# Patient Record
Sex: Female | Born: 1975 | Race: Black or African American | Hispanic: No | Marital: Single | Smoking: Current every day smoker
Health system: Southern US, Community
[De-identification: ages and names within clinical notes are randomized; demographics above are authoritative.]

---

## 2006-11-24 ENCOUNTER — Emergency Department: Payer: Self-pay | Admitting: Emergency Medicine

## 2007-07-28 ENCOUNTER — Emergency Department: Payer: Self-pay | Admitting: Unknown Physician Specialty

## 2008-11-23 ENCOUNTER — Emergency Department: Payer: Self-pay | Admitting: Internal Medicine

## 2010-05-29 ENCOUNTER — Emergency Department: Payer: Self-pay | Admitting: Emergency Medicine

## 2013-03-26 ENCOUNTER — Emergency Department: Payer: Self-pay | Admitting: Emergency Medicine

## 2013-10-01 ENCOUNTER — Emergency Department: Payer: Self-pay | Admitting: Internal Medicine
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
## 2017-12-24 ENCOUNTER — Other Ambulatory Visit: Payer: Self-pay

## 2017-12-24 ENCOUNTER — Emergency Department: Payer: Self-pay

## 2017-12-24 ENCOUNTER — Emergency Department
Admission: EM | Admit: 2017-12-24 | Discharge: 2017-12-24 | Disposition: A | Discharge status: Home / Self Care | Payer: Self-pay | Attending: Emergency Medicine | Admitting: Emergency Medicine

## 2017-12-24 DIAGNOSIS — R600 Localized edema: Secondary | ICD-10-CM | POA: Insufficient documentation

## 2017-12-24 DIAGNOSIS — M79662 Pain in left lower leg: Secondary | ICD-10-CM | POA: Insufficient documentation

## 2017-12-24 MED ORDER — MELOXICAM 7.5 MG PO TABS: 7.5000 mg | ORAL_TABLET | Freq: Every day | ORAL | 1 refills | Status: AC

## 2017-12-24 NOTE — ED Provider Notes (Signed)
[facility] Emergency Department Provider Note  ____________________________________________  Time seen: Approximately 8:23 PM  I have reviewed the triage vital signs and the nursing notes.   HISTORY  Chief Complaint Knee Pain    HPI Sandra Goodwin is a 42 y.o. female presents to the emergency department with 5 out of 10 left calf pain for the past 3 weeks.  Patient reports that pain radiates from left ankle into left calf.  She denies falls or mechanisms of trauma.  Patient reports noticing left ankle edema.  She denies a history of cough or increased pillow usage.  No history of CHF.  Patient denies a history of DVTs, recent travel, recent surgery or prolonged immobilization.  Patient is a daily smoker.  She denies chest pain, chest tightness, shortness of breath, nausea, vomiting abdominal pain.  No history of increased physical activity.   No past medical history on file.  There are no active problems to display for this patient.    Prior to Admission medications   Medication Sig Start Date End Date Taking? Authorizing Provider  meloxicam (MOBIC) 7.5 MG tablet Take 1 tablet (7.5 mg total) by mouth daily for 7 days. 12/24/17 12/31/17  Orvil FeilWoods, Johnpaul Gillentine M, PA-C    Allergies Patient has no known allergies.  No family history on file.  Social History Social History   Tobacco Use  . Smoking status: Not on file  Substance Use Topics  . Alcohol use: Not on file  . Drug use: Not on file     Review of Systems  Constitutional: No fever/chills Eyes: No visual changes. No discharge ENT: No upper respiratory complaints. Cardiovascular: no chest pain. Respiratory: no cough. No SOB. Gastrointestinal: No abdominal pain.  No nausea, no vomiting.  No diarrhea.  No constipation. Musculoskeletal: Patient has left calf pain.  Skin: Negative for rash, abrasions, lacerations, ecchymosis. Neurological: Negative for headaches, focal weakness or  numbness.  ____________________________________________   PHYSICAL EXAM:  VITAL SIGNS: ED Triage Vitals  Enc Vitals Group     BP 12/24/17 1917 (!) 130/103     Pulse Rate 12/24/17 1917 75     Resp 12/24/17 1917 20     Temp 12/24/17 1917 98.5 F (36.9 C)     Temp Source 12/24/17 1917 Oral     SpO2 12/24/17 1917 100 %     Weight 12/24/17 1918 195 lb (88.5 kg)     Height 12/24/17 1918 5' (1.524 m)     Head Circumference --      Peak Flow --      Pain Score 12/24/17 1917 8     Pain Loc --      Pain Edu? --      Excl. in GC? --      Constitutional: Alert and oriented. Well appearing and in no acute distress. Eyes: Conjunctivae are normal. PERRL. EOMI. Head: Atraumatic. Cardiovascular: Normal rate, regular rhythm. Normal S1 and S2.  Good peripheral circulation. Respiratory: Normal respiratory effort without tachypnea or retractions. Lungs CTAB. Good air entry to the bases with no decreased or absent breath sounds. Musculoskeletal: Patient is able to perform full dorsiflexion and plantar flexion at the left ankle.  She is able to perform inversion and eversion.  She is able to move all 5 left toes.  Palpable dorsalis pedis pulse, left.  No pain is elicited with palpation of the left calf. Neurologic:  Normal speech and language. No gross focal neurologic deficits are appreciated.  Skin: No erythema or  edema overlying the left calf.  ___________________________________  LABS (all labs ordered are listed, but only abnormal results are displayed)  Labs Reviewed - No data to display ____________________________________________  EKG   ____________________________________________  RADIOLOGY Geraldo Pitter, personally viewed and evaluated these images as part of my medical decision making, as well as reviewing the written report by the radiologist.    Dg Ankle Complete Left  Result Date: 12/24/2017 CLINICAL DATA:  42 year old female with left foot cramping and pain while  walking. No known injury. EXAM: LEFT ANKLE COMPLETE - 3+ VIEW COMPARISON:  None. FINDINGS: There is no evidence of fracture, dislocation, or joint effusion. There is no evidence of arthropathy or other focal bone abnormality. Soft tissues are unremarkable. Remote healed fracture of the fifth metatarsal. IMPRESSION: 1. No acute abnormality. 2. Remote healed fracture of the fifth metatarsal. Electronically Signed   By: Malachy Moan M.D.   On: 12/24/2017 20:30   US Venous Img Lower Unilateral Left  Result Date: 12/24/2017 CLINICAL DATA:  Left lower extremity pain for 3 weeks. EXAM: LEFT LOWER EXTREMITY VENOUS DOPPLER ULTRASOUND TECHNIQUE: Gray-scale sonography with graded compression, as well as color Doppler and duplex ultrasound were performed to evaluate the lower extremity deep venous systems from the level of the common femoral vein and including the common femoral, femoral, profunda femoral, popliteal and calf veins including the posterior tibial, peroneal and gastrocnemius veins when visible. The superficial great saphenous vein was also interrogated. Spectral Doppler was utilized to evaluate flow at rest and with distal augmentation maneuvers in the common femoral, femoral and popliteal veins. COMPARISON:  None. FINDINGS: Contralateral Common Femoral Vein: Respiratory phasicity is normal and symmetric with the symptomatic side. No evidence of thrombus. Normal compressibility. Common Femoral Vein: No evidence of thrombus. Normal compressibility, respiratory phasicity and response to augmentation. Saphenofemoral Junction: No evidence of thrombus. Normal compressibility and flow on color Doppler imaging. Profunda Femoral Vein: No evidence of thrombus. Normal compressibility and flow on color Doppler imaging. Femoral Vein: No evidence of thrombus. Normal compressibility, respiratory phasicity and response to augmentation. Popliteal Vein: No evidence of thrombus. Normal compressibility, respiratory  phasicity and response to augmentation. Calf Veins: No evidence of thrombus. Normal compressibility and flow on color Doppler imaging. Superficial Great Saphenous Vein: No evidence of thrombus. Normal compressibility. Venous Reflux:  None. Other Findings: Simple 2.9 x 1.1 x 3.1 cm Baker's cyst in the medial left popliteal fossa. IMPRESSION: No evidence of deep venous thrombosis in the left lower extremity. Left-sided Baker's cyst as detailed. Electronically Signed   By: Delbert Phenix M.D.   On: 12/24/2017 21:46    ____________________________________________    PROCEDURES  Procedure(s) performed:    Procedures    Medications - No data to display   ____________________________________________   INITIAL IMPRESSION / ASSESSMENT AND PLAN / ED COURSE  Pertinent labs & imaging results that were available during my care of the patient were reviewed by me and considered in my medical decision making (see chart for details).  Review of the [place] CSRS was performed in accordance of the [facility] prior to dispensing any controlled drugs.     Assessment and Plan:  Left calf pain Patient presents to the emergency department with left calf pain that has occurred intermittently for the past 3 weeks in the absence of trauma.  Differential diagnosis originally included DVT versus musculoskeletal strain versus arthritis.  Workup conducted in the emergency department was noncontributory for thromboembolism.  X-ray examination revealed no acute fractures or bony  abnormalities.  Patient was discharged with meloxicam and advised to seek care with podiatry if symptoms persist.  Strict return precautions were given to return to the emergency department for new or worsening symptoms.  All patient questions were answered.    ____________________________________________  FINAL CLINICAL IMPRESSION(S) / ED DIAGNOSES  Final diagnoses:  Pain of left calf      NEW MEDICATIONS STARTED DURING THIS VISIT:  ED  Discharge Orders        Ordered    meloxicam (MOBIC) 7.5 MG tablet  Daily     12/24/17 2231          This chart was dictated using voice recognition software/Dragon. Despite best efforts to proofread, errors can occur which can change the meaning. Any change was purely unintentional.    Orvil Feil, PA-C 12/25/17 Donnamarie Rossetti, MD 12/25/17 [phone]

## 2017-12-24 NOTE — ED Triage Notes (Signed)
Pt in with co cramping to left foot radiating up to left knee. States it "locks up" at times making it painful to walk. Denies any injury, states has had pain for about a month.

## 2020-04-26 ENCOUNTER — Other Ambulatory Visit: Payer: Self-pay | Admitting: Physician Assistant

## 2020-04-26 DIAGNOSIS — N611 Abscess of the breast and nipple: Secondary | ICD-10-CM

## 2020-05-04 ENCOUNTER — Ambulatory Visit
Admission: RE | Admit: 2020-05-04 | Discharge: 2020-05-04 | Disposition: A | Discharge status: Home / Self Care | Payer: BC Managed Care – PPO | Source: Ambulatory Visit | Attending: Physician Assistant | Admitting: Physician Assistant

## 2020-05-04 DIAGNOSIS — N611 Abscess of the breast and nipple: Secondary | ICD-10-CM | POA: Diagnosis not present

## 2020-05-05 ENCOUNTER — Other Ambulatory Visit: Payer: Self-pay

## 2020-05-05 ENCOUNTER — Other Ambulatory Visit: Payer: Self-pay | Admitting: Physician Assistant

## 2020-05-05 DIAGNOSIS — R922 Inconclusive mammogram: Secondary | ICD-10-CM

## 2020-10-10 ENCOUNTER — Encounter: Payer: Self-pay | Admitting: Podiatry

## 2020-10-10 ENCOUNTER — Other Ambulatory Visit: Payer: Self-pay

## 2020-10-10 ENCOUNTER — Ambulatory Visit: Payer: BC Managed Care – PPO | Admitting: Podiatry

## 2020-10-10 DIAGNOSIS — B070 Plantar wart: Secondary | ICD-10-CM

## 2020-10-10 NOTE — Progress Notes (Signed)
  Subjective:  Patient ID: Sandra Goodwin, female    DOB: Nov 14, 1976,  MRN: [id number]  Chief Complaint  Patient presents with  . Callouses    Patient presents today for painful callous lesion bottom of right forefoot x 1 year.  She says "feels like Im walking on a rock"     44 y.o. female presents with the above complaint. History confirmed with patient. Has tried getting removed with pedicures w/o success  Objective:  Physical Exam: warm, good capillary refill, no trophic changes or ulcerative lesions, normal DP and PT pulses and normal sensory exam.   Right Foot: central submet 3-4 lesion with disrupted skin lines and hyperkeratosis, tender with lateral compression    Assessment:   1. Verruca plantaris      Plan:  Patient was evaluated and treated and all questions answered.   -Educated on etiology -Lesion debrided and destroyed with cantharone and salinocaine -Recommend daily Sal Acid use OTC -Offloading pad applied  Return in about 3 weeks (around 10/31/2020).

## 2020-10-10 NOTE — Patient Instructions (Signed)
Take dressing off in 8 hours and wash the foot with soap and water. If it is hurting or becomes uncomfortable before the 8 hours, go ahead and remove the bandage and wash the area.  If it blisters, apply antibiotic ointment and a band-aid.  Monitor for any signs/symptoms of infection. Call the office immediately if any occur or go directly to the emergency room. Call with any questions/concerns.   

## 2020-10-19 ENCOUNTER — Encounter: Payer: Self-pay | Admitting: Podiatry

## 2020-11-02 ENCOUNTER — Ambulatory Visit: Payer: BC Managed Care – PPO | Admitting: Podiatry

## 2021-10-30 IMAGING — MG DIGITAL DIAGNOSTIC BILAT W/ TOMO W/ CAD
8 series · 8 of 24 positions shown · non-contrast
Comparison: None.

CLINICAL DATA: Recent abscess to the right breast. The patient is a
smoker. The patient is finishing her second course of antibiotics
with significant clinical improvement.

EXAM:
DIGITAL DIAGNOSTIC BILATERAL MAMMOGRAM WITH CAD AND TOMO
ULTRASOUND RIGHT BREAST

[L MLO synth-2D]
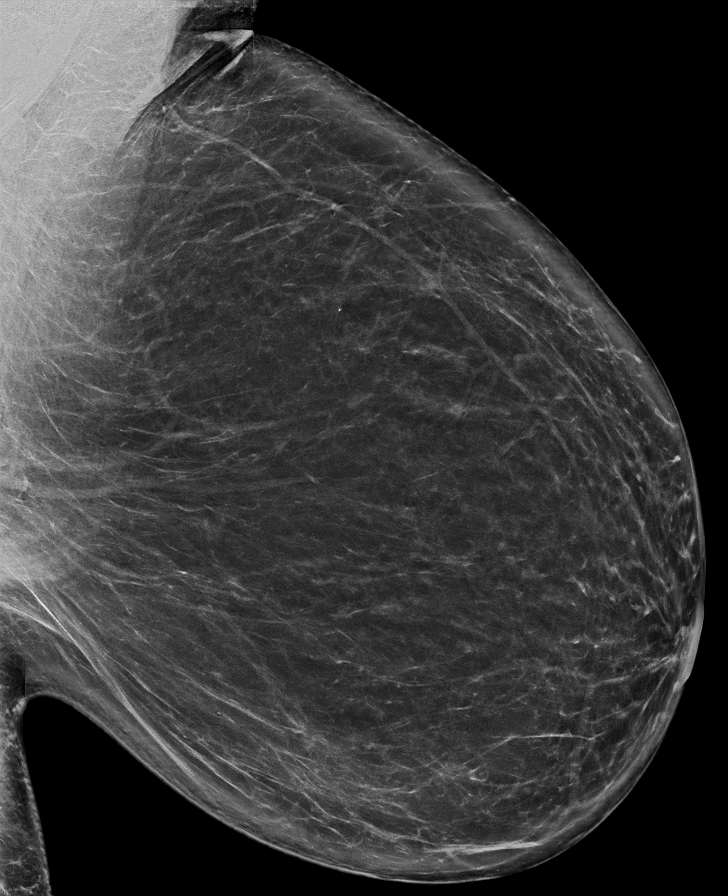

[R CC synth-2D]
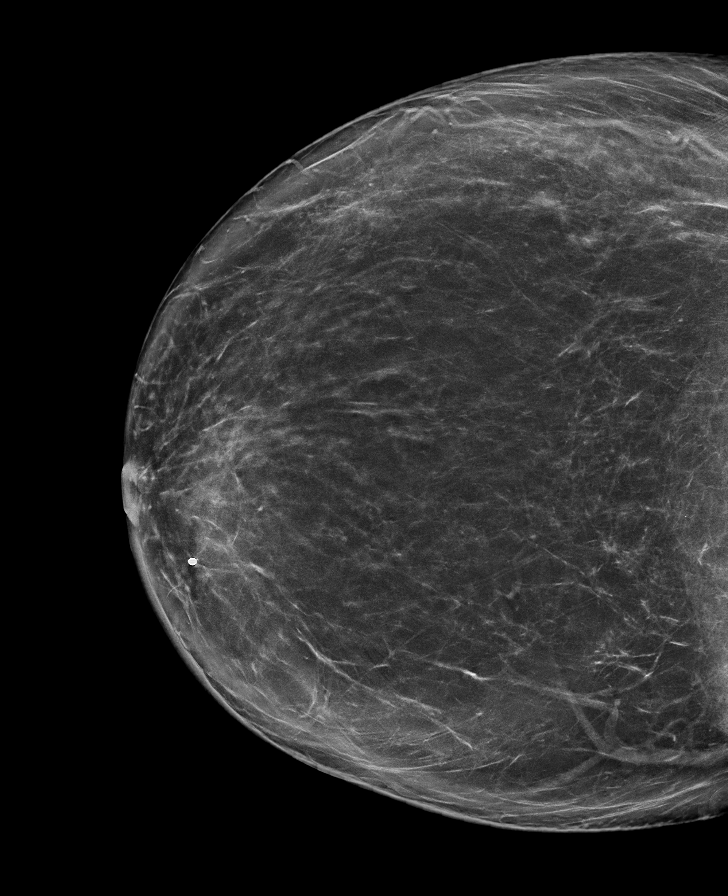

[R MLO synth-2D]
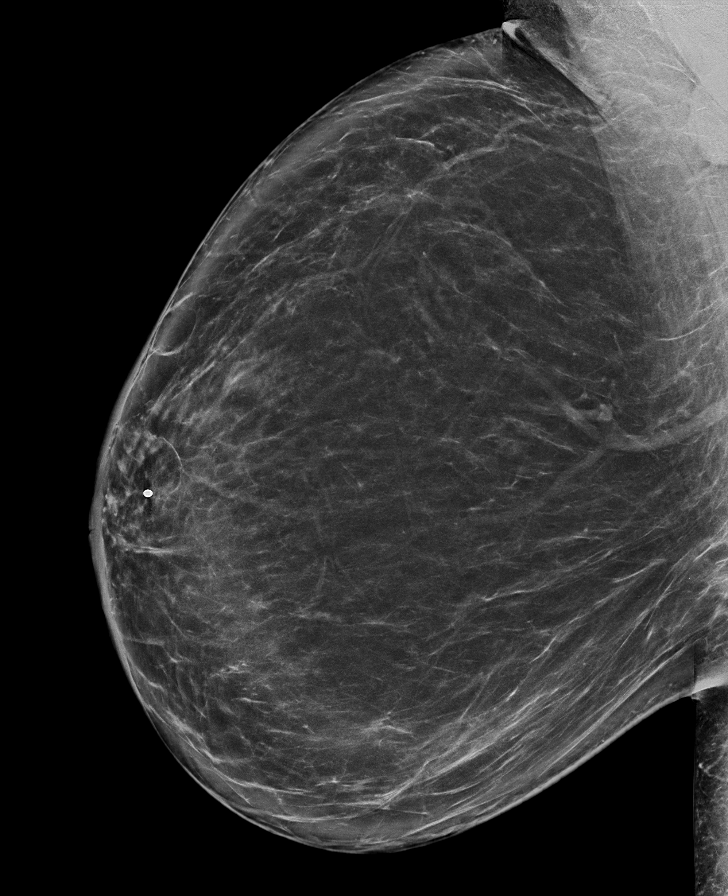

[L CC synth-2D]
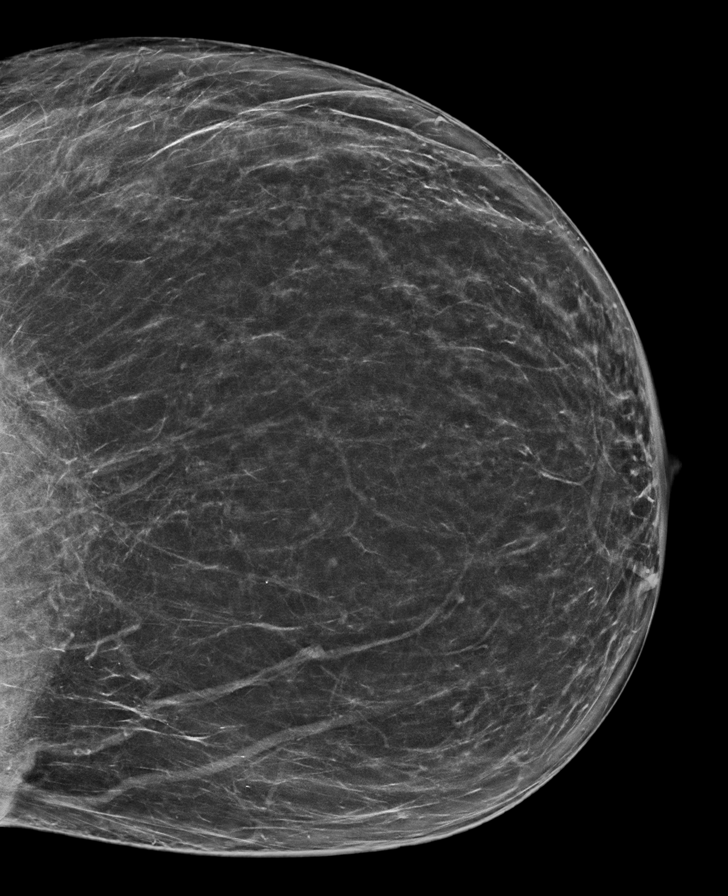

[R MLO tomo · tomo slice 53/106.0]
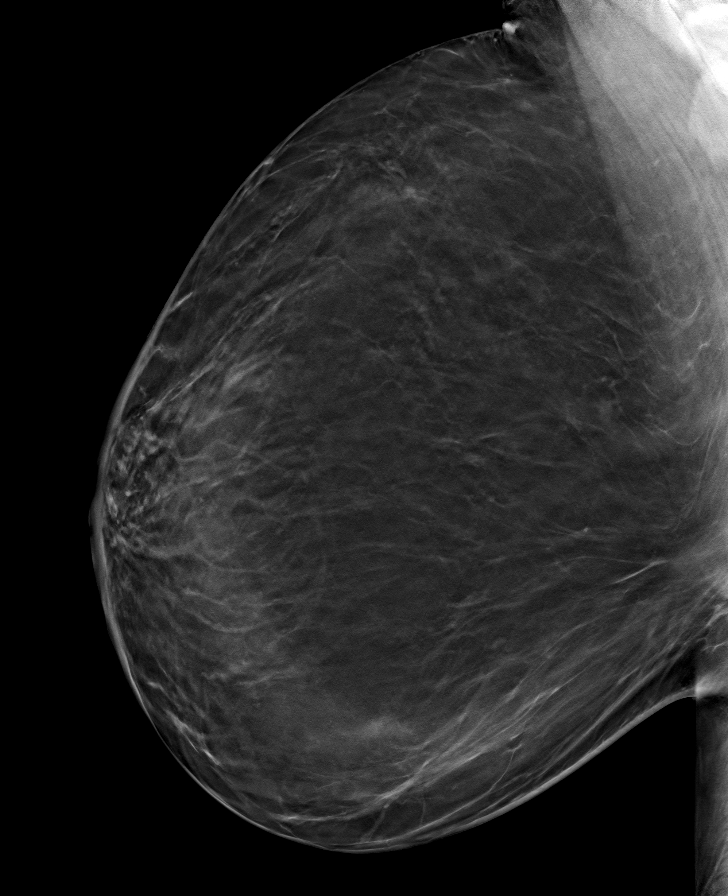

[L MLO tomo · tomo slice 49/98.0]
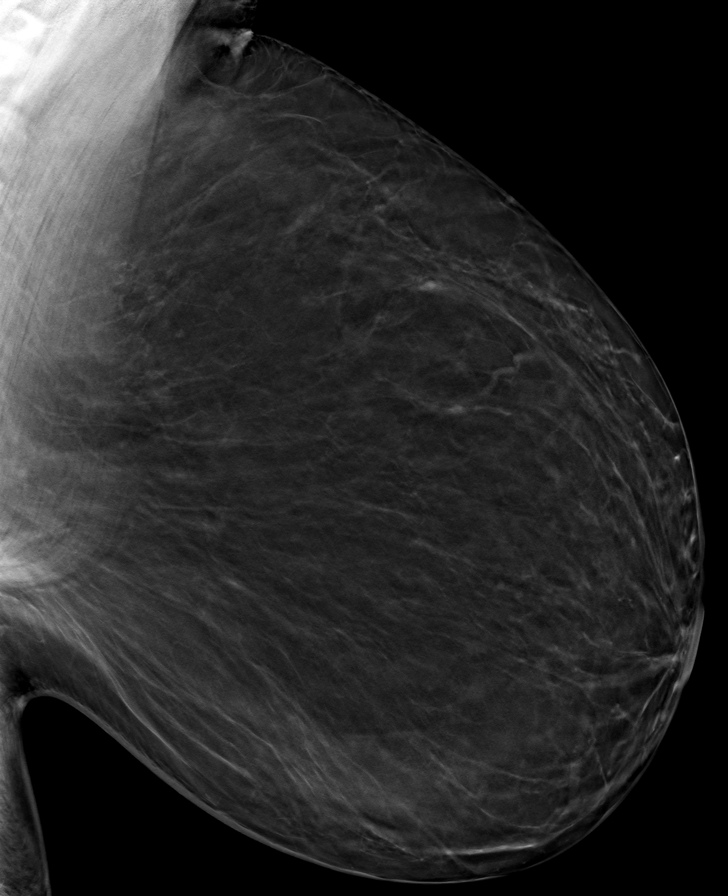

[R CC tomo · tomo slice 51/101.0]
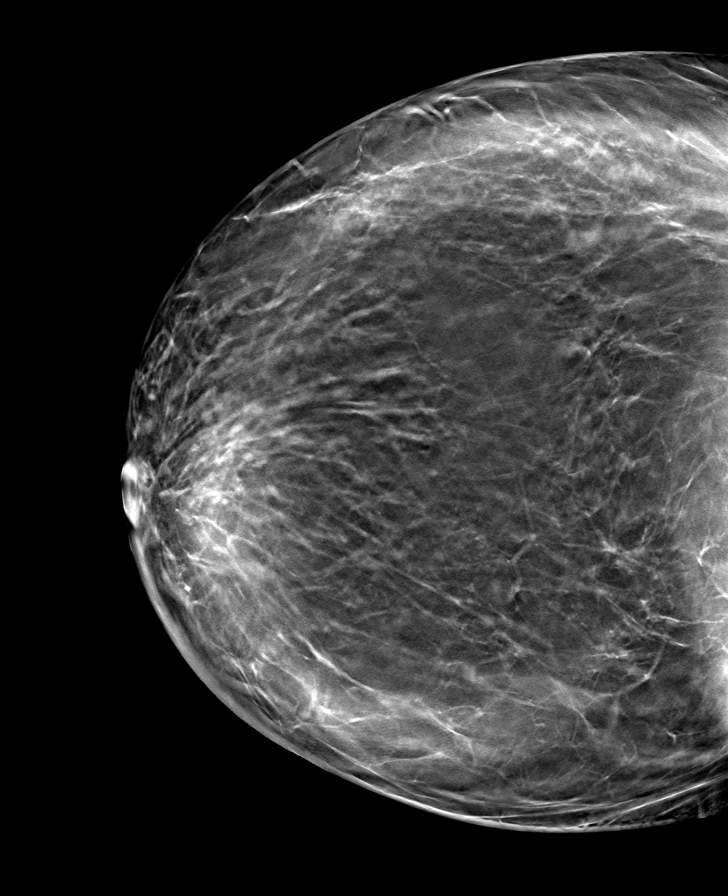

[L CC tomo · tomo slice 41/81.0]
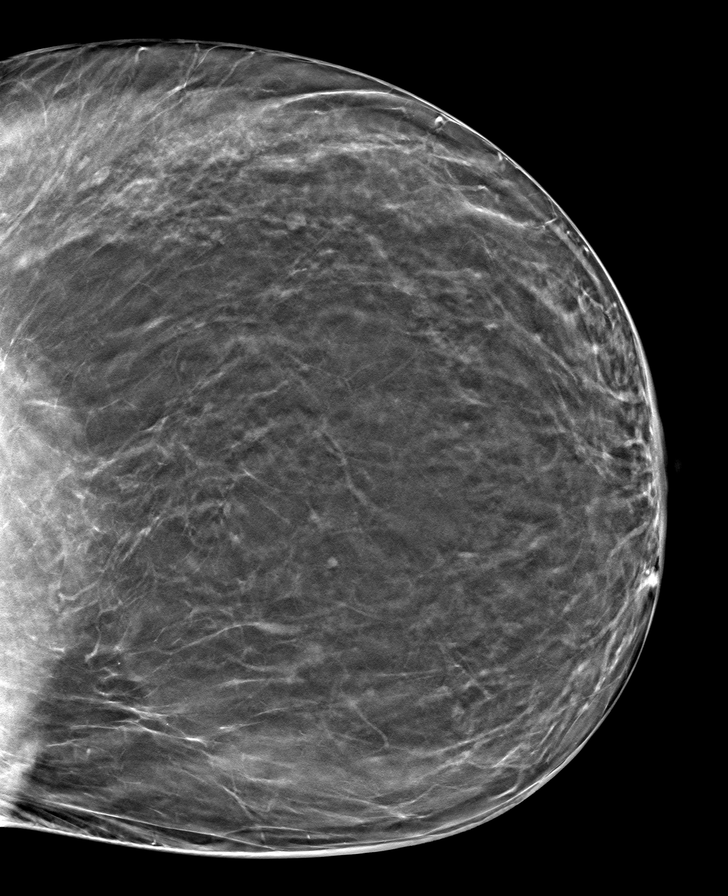

[8 of 24 positions shown; findings below may reference images not displayed]

ACR Breast Density Category b: There are scattered areas of
fibroglandular density.
FINDINGS: No suspicious masses, calcifications, or distortion identified in
either breast. There is increased density in the right breast
extending from the level the nipple medially and inferiorly at the
site of her recent abscess. There is an enlarged lymph node in the
right axilla, partially imaged.

Mammographic images were processed with CAD.

On physical exam, there is some continued skin thickening at the
site of her recent infection.

Targeted ultrasound is performed, showing skin thickening in the
region of the patient's recent infection. No remaining abscess.
There is an enlarged lymph node in the right axilla with a cortex
measuring up to 10 mm.
IMPRESSION: The increased density in the right breast extending from the nipple
medially and inferiorly correlates with the skin thickening from her
recent abscess which is clinically significantly improved per the
patient. The enlarged lymph node in the right axilla is almost
certainly reactive given the recent infection. No evidence of
malignancy in either breast.

RECOMMENDATION:
Recommend three-month follow-up mammogram and ultrasound of the
right breast and right axilla to ensure resolution of the findings
described above which are almost certainly from the patient's recent
infection.

I have discussed the findings and recommendations with the patient.
If applicable, a reminder letter will be sent to the patient
regarding the next appointment.

BI-RADS CATEGORY  3: Probably benign.

## 2022-11-05 ENCOUNTER — Encounter: Payer: Self-pay | Admitting: Surgery

## 2022-11-05 ENCOUNTER — Ambulatory Visit: Payer: BC Managed Care – PPO | Admitting: Surgery

## 2022-11-05 VITALS — BP 129/86 | HR 82 | Temp 97.7°F | Ht 61.0 in | Wt 180.4 lb

## 2022-11-05 DIAGNOSIS — N611 Abscess of the breast and nipple: Secondary | ICD-10-CM

## 2022-11-05 MED ORDER — CEPHALEXIN 500 MG PO CAPS: 500.0000 mg | ORAL_CAPSULE | Freq: Two times a day (BID) | ORAL | 0 refills | Status: AC

## 2022-11-05 NOTE — Progress Notes (Unsigned)
11/05/2022  Reason for Visit: Right breast abscess  History of Present Illness: Sandra Goodwin is a 46 y.o. female presenting for evaluation of a right breast abscess.  The patient reports having an injury at work on 10/16/22 during which she was lifting a fountain soda bib and smashed her right breast with it.  She reports that initially she felt an area of soreness like a bruise in the right breast, but this progressively started to become harder/firmer and more tender.  She received a course of antibiotics (Keflex) which she felt had some improvement but the area continued to progress and eventually burst and started draining.  She reports the drainage was purulent, foul smelling.  She reports that the drainage has slowed down and the pain is improving.  She completed antibiotics last week.  Denies any fevers, chills.   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  Past Medical History: History reviewed. No pertinent past medical history.   Past Surgical History: History reviewed. No pertinent surgical history.  Home Medications: Prior to Admission medications   Medication Sig Start Date End Date Taking? Authorizing Provider  cephALEXin (KEFLEX) 500 MG capsule Take 1 capsule (500 mg total) by mouth 2 (two) times daily for 7 days. 11/05/22 11/12/22  Henrene Dodge, MD    Allergies: No Known Allergies  Social History:  reports that she has been smoking cigarettes. She has never used smokeless tobacco. She reports current alcohol use. No history on file for drug use.   Family History: History reviewed. No pertinent family history.  Review of Systems: Review of Systems  Constitutional:  Negative for chills and fever.  HENT:  Negative for hearing loss.   Respiratory:  Negative for shortness of breath.   Cardiovascular:  Negative for chest pain.  Gastrointestinal:  Negative for abdominal pain, nausea and vomiting.  Skin:        Right breast abscess     Physical Exam BP 129/86   Pulse 82   Temp 97.7 F (36.5 C) (Oral)   Ht 5\' 1"  (1.549 m)   Wt 180 lb 6.4 oz (81.8 kg)   SpO2 99%   BMI 34.09 kg/m  CONSTITUTIONAL: No acute distress, well-nourished HEENT:  Normocephalic, atraumatic, extraocular motion intact. RESPIRATORY:  Normal respiratory effort without pathologic use of accessory muscles. CARDIOVASCULAR: Regular rhythm and rate. BREAST: Right breast has an area of an open wound which is about 1 cm in size.  Q-tip was able to probe this in the abscess cavity measures about 3 x 3 cm.  There is no purulent drainage and only serous fluid.  The roof of the abscess cavity consists of the open wound surrounded by ulcerated skin which has lost its epidermal layer.  Otherwise the roof is intact.  There is 1 area of fibrinous material at the wound opening measuring about 5 mm x 5 mm in size which was sharply debrided with scissors. MUSCULOSKELETAL:  Normal muscle strength and tone in all four extremities.  No peripheral edema or cyanosis. NEUROLOGIC:  Motor and sensation is grossly normal.  Cranial nerves are grossly intact. PSYCH:  Alert and oriented to person, place and time. Affect is normal.  Laboratory Analysis: No results found for this or any previous visit (from the past 24 hour(s)).  Imaging: No results found.  Assessment and Plan: This is a 46 y.o. female with a right breast abscess.  - The patient has an injury from work on 10/16/2022.  Discussed  with patient that potentially this may have resulted in a hematoma which became infected.  As the infection continued to progress, discussed with patient that the right breast will become more tender as well as firmer in the area of induration and infection and eventually given the amount of pressure, the infection without a exit point at the skin and that is where she started draining from.  Given the enough pressure, also part of the skin layer has been denuded.  Now that the patient has  been draining, there is no further residual purulent fluid and now she is only having some serous fluid.  Discussed with patient that as a precaution, we will continue with a course of Keflex for 7 more days.  Discussed with her doing dry gauze dressing changes in order to catch any residual drainage.  The cavity is small enough that I do not think right now she would necessarily need packing with quarter inch gauze, particularly given that the wound opening is small. - Patient understands this plan, and all of her questions have been answered. - As a precaution, discussed with patient that we will be better for her to stay off work over the next 2 weeks to allow the wound to continue healing so they can also stay cleaner.  I will see her in 10 days so we can reevaluate the wound to determine whether she can go back to work in 2 weeks or if we need to extend that for longer.. - Follow-up on 11/14/2022.  I spent 30 minutes dedicated to the care of this patient on the date of this encounter to include pre-visit review of records, face-to-face time with the patient discussing diagnosis and management, and any post-visit coordination of care.   Howie Ill, MD [facility]

## 2022-11-05 NOTE — Patient Instructions (Addendum)
Please pick up your prescription at your pharmacy. Change your dressing 1-2 times daily.    If you have any concerns or questions, please feel free to call our office. See follow up appointment below.   Mastitis  Mastitis is irritation and swelling (inflammation) in an area of the breast. This most often happens in women who are breastfeeding, but it can happen to other women too, as well as some men. A doctor will help decide if treatment is needed. What are the causes? This condition is caused by: Germs (bacteria). This can happen when germs enter the breast through cuts or openings in the skin. A plugged milk duct. This happens when something blocks the flow of milk in the breast. Nipple piercing. The pierced area can allow germs to enter the breast. Some types of breast cancer. What are the signs or symptoms? Swelling, redness, and pain in the breast. Swelling of the glands under the arm. Fluid flowing from the nipple. Feeling very tired. Headache and body aches. Fever and chills. Vomiting or feeling like you may vomit. Fast heart rate. Symptoms often last 2-5 days. The pain and redness can be the worst on days 2 and 3. This will often go away by day 5. If the infection is not treated, pus or a pocket of fluid may form under the skin (abscess). How is this diagnosed? This condition can usually be diagnosed based on a physical exam and your symptoms. You may also have other tests, such as: Blood tests to check if your body is fighting infection. X-rays or ultrasounds of the breast. Fluid tests. If a pocket of fluid has formed, the fluid may be taken out with a needle. The fluid may be checked to see whether germs are present. Breast milk testing. A sample of breast milk may be tested for germs. This is done only when breastfeeding or pumping. How is this treated? Mastitis will sometimes go away on its own, so your doctor may choose to wait 24 hours after first seeing you to decide  if medicine is needed. This condition may be treated with: Continuing to breastfeed or pump from both breasts to allow milk flow and prevent a pocket of fluid from forming. Using hot or cold compresses. Taking medicine for pain. Taking antibiotic medicine. Rest. Drinking plenty of fluids. Taking out fluid with a needle, if a pocket of fluid has formed. Follow these instructions at home: Breast care  Keep your nipples clean and dry. If told, put heat on the affected area of your breast. Use the heat source that your doctor or lactation specialist tells you to use. If told, put ice on the affected area of your breast. To do this: Put ice in a plastic bag. Place a towel between your skin and the bag. Leave the ice on for 20 minutes, 2-3 times a day. Take off the ice if your skin turns bright red. This is very important. If you cannot feel pain, heat, or cold, you have a greater risk of damage to the area. Breastfeeding and pumping tips Continue to breastfeed your baby on demand. This means feeding your baby whenever he or she is hungry. Ask your doctor or lactation specialist whether you need to change your breastfeeding routine. Avoid using nipple shields, if possible. Ask a lactation specialist for help. Change the breast you offer first at each feeding to make sure your baby feeds from both breasts. Offer both breasts to your baby every time your baby feeds. Use  gentle breast massage during feeding or pumping sessions only as told by your doctor or lactation specialist. Avoid letting your breasts get very full with milk (engorged). If your breasts are very full, you can hand express a small amount of milk for comfort. If you are pumping, keep pumping on the same schedule as you were before. In the breast with mastitis, pump until very little milk is coming out. Do not empty your breast. Emptying your breast causes your body to make more milk and can make symptoms worse. Ask your doctor or  lactation specialist whether you need to change your pumping routine. Medicines Take over-the-counter and prescription medicines only as told by your doctor. If you were prescribed an antibiotic medicine, take it as told by your doctor. Do not stop taking it even if you start to feel better. Contact a doctor if: You have pus-like fluid leaking from your breast. You have a fever. Your symptoms do not get better within 2 days of starting treatment. Get help right away if: Your pain and swelling are getting worse. Your pain is not helped by medicine. You have a red line going from your breast toward your armpit. Summary Mastitis is irritation and swelling in an area of the breast. Mastitis will sometimes go away on its own. Get plenty of rest. Contact a doctor if your symptoms do not get better within 2 days. If you were prescribed an antibiotic medicine, do not stop taking it even if you start to feel better. This information is not intended to replace advice given to you by your health care provider. Make sure you discuss any questions you have with your health care provider. Document Revised: 12/29/2021 Document Reviewed: 09/26/2020 Elsevier Patient Education  2023 [employer].

## 2022-11-13 ENCOUNTER — Telehealth: Payer: Self-pay

## 2022-11-13 NOTE — Telephone Encounter (Signed)
Faxed medical records to [facility] at [employer] at [phone].

## 2022-11-14 ENCOUNTER — Other Ambulatory Visit: Payer: Self-pay

## 2022-11-14 ENCOUNTER — Encounter: Payer: Self-pay | Admitting: Surgery

## 2022-11-14 ENCOUNTER — Ambulatory Visit (INDEPENDENT_AMBULATORY_CARE_PROVIDER_SITE_OTHER): Payer: BC Managed Care – PPO | Admitting: Surgery

## 2022-11-14 VITALS — BP 126/81 | HR 62 | Temp 97.8°F | Ht 61.0 in | Wt 181.0 lb

## 2022-11-14 DIAGNOSIS — N611 Abscess of the breast and nipple: Secondary | ICD-10-CM | POA: Diagnosis not present

## 2022-11-14 DIAGNOSIS — J399 Disease of upper respiratory tract, unspecified: Secondary | ICD-10-CM | POA: Insufficient documentation

## 2022-11-14 NOTE — Patient Instructions (Signed)
   Follow-up with our office as needed.  Please call and ask to speak with a nurse if you develop questions or concerns.  

## 2022-11-14 NOTE — Progress Notes (Signed)
  11/14/2022  History of Present Illness: Sandra Goodwin is a 46 y.o. female presenting for follow-up of a right breast abscess from likely an infected hematoma.  She was last seen on 11/05/2022.  She completed the course of Keflex since then.  Wound currently is healing but reports that she still having some bleeding.  Has any further pain.  Past Medical History: History reviewed. No pertinent past medical history.   Past Surgical History: History reviewed. No pertinent surgical history.  Home Medications: Prior to Admission medications   Not on File    Allergies: No Known Allergies  Review of Systems: Review of Systems  Constitutional:  Negative for chills and fever.  Respiratory:  Negative for shortness of breath.   Cardiovascular:  Negative for chest pain.  Skin:        Right breast healing wound    Physical Exam BP 126/81   Pulse 62   Temp 97.8 F (36.6 C) (Oral)   Ht 5\' 1"  (1.549 m)   Wt 181 lb (82.1 kg)   SpO2 99%   BMI 34.20 kg/m  CONSTITUTIONAL: No acute distress, well-nourished HEENT:  Normocephalic, atraumatic, extraocular motion intact. RESPIRATORY:  Normal respiratory effort without pathologic use of accessory muscles. CARDIOVASCULAR: Regular rhythm and rate. BREAST: Right breast wound is healing appropriately with now a very superficial wound with no particular depth.  Currently there is no drainage.  There is no erythema or induration.  The area that is oozing is more from the exposed tissue as the skin still has not closed over yet NEUROLOGIC:  Motor and sensation is grossly normal.  Cranial nerves are grossly intact. PSYCH:  Alert and oriented to person, place and time. Affect is normal.   Assessment and Plan: This is a 46 y.o. female with a right breast abscess from an infected hematoma.  - The patient's wound is currently healing well and is very shallow now.  Now she just has the skin to close over.  The reason for the oozing is because of the  exposed raw tissue but this will continue to improve as the skin closes over.  No further antibiotics are needed.  No other procedures are needed at this point. - Patient may return to work tomorrow and we will give her a work note for this. - Follow-up as needed.  I spent 20 minutes dedicated to the care of this patient on the date of this encounter to include pre-visit review of records, face-to-face time with the patient discussing diagnosis and management, and any post-visit coordination of care.   49, MD [facility]
